# Patient Record
Sex: Female | Born: 1995 | Race: White | Hispanic: No | Marital: Single | Smoking: Former smoker
Health system: Southern US, Community
[De-identification: ages and names within clinical notes are randomized; demographics above are authoritative.]

## PROBLEM LIST (undated history)

## (undated) DIAGNOSIS — J353 Hypertrophy of tonsils with hypertrophy of adenoids: Secondary | ICD-10-CM

## (undated) DIAGNOSIS — I10 Essential (primary) hypertension: Secondary | ICD-10-CM

## (undated) DIAGNOSIS — G43909 Migraine, unspecified, not intractable, without status migrainosus: Secondary | ICD-10-CM

## (undated) DIAGNOSIS — T753XXA Motion sickness, initial encounter: Secondary | ICD-10-CM

## (undated) HISTORY — PX: TONSILLECTOMY: SUR1361

## (undated) HISTORY — PX: NO PAST SURGERIES: SHX2092

---

## 2017-09-18 ENCOUNTER — Encounter: Payer: Self-pay | Admitting: *Deleted

## 2017-09-18 DIAGNOSIS — J069 Acute upper respiratory infection, unspecified: Secondary | ICD-10-CM | POA: Diagnosis not present

## 2017-09-18 DIAGNOSIS — R05 Cough: Secondary | ICD-10-CM | POA: Diagnosis not present

## 2017-09-18 DIAGNOSIS — B9789 Other viral agents as the cause of diseases classified elsewhere: Secondary | ICD-10-CM | POA: Diagnosis not present

## 2017-09-18 DIAGNOSIS — R000 Tachycardia, unspecified: Secondary | ICD-10-CM | POA: Diagnosis not present

## 2017-09-18 DIAGNOSIS — J029 Acute pharyngitis, unspecified: Secondary | ICD-10-CM | POA: Diagnosis present

## 2017-09-18 DIAGNOSIS — R509 Fever, unspecified: Secondary | ICD-10-CM | POA: Insufficient documentation

## 2017-09-18 MED ORDER — ACETAMINOPHEN 500 MG PO TABS
1000.0000 mg | ORAL_TABLET | Freq: Once | ORAL | Status: AC
  Administered 2017-09-18: 1000 mg via ORAL
  Filled 2017-09-18: qty 2

## 2017-09-18 NOTE — ED Triage Notes (Signed)
Pt reports sore throat, fever cough for several weeks.  Worsening yesterday.  Pt is a IT sales professional.  Face flushed.

## 2017-09-19 ENCOUNTER — Emergency Department: Payer: Medicaid Other

## 2017-09-19 ENCOUNTER — Emergency Department
Admission: EM | Admit: 2017-09-19 | Discharge: 2017-09-19 | Disposition: A | Discharge status: Home / Self Care | Payer: Medicaid Other | Attending: Student in an Organized Health Care Education/Training Program | Admitting: Student in an Organized Health Care Education/Training Program

## 2017-09-19 DIAGNOSIS — J069 Acute upper respiratory infection, unspecified: Secondary | ICD-10-CM

## 2017-09-19 DIAGNOSIS — B9789 Other viral agents as the cause of diseases classified elsewhere: Secondary | ICD-10-CM

## 2017-09-19 LAB — CBC
HEMATOCRIT: 35.9 % (ref 35.0–47.0)
HEMOGLOBIN: 12.5 g/dL (ref 12.0–16.0)
MCH: 27.3 pg (ref 26.0–34.0)
MCHC: 34.8 g/dL (ref 32.0–36.0)
MCV: 78.4 fL — AB (ref 80.0–100.0)
Platelets: 317 10*3/uL (ref 150–440)
RBC: 4.59 MIL/uL (ref 3.80–5.20)
RDW: 14.3 % (ref 11.5–14.5)
WBC: 14.9 10*3/uL — AB (ref 3.6–11.0)

## 2017-09-19 LAB — BASIC METABOLIC PANEL
Anion gap: 10 (ref 5–15)
BUN: 11 mg/dL (ref 6–20)
CALCIUM: 9.2 mg/dL (ref 8.9–10.3)
CO2: 22 mmol/L (ref 22–32)
CREATININE: 0.9 mg/dL (ref 0.44–1.00)
Chloride: 105 mmol/L (ref 101–111)
GFR calc Af Amer: >60 mL/min (ref >60)
GLUCOSE: 116 mg/dL — AB (ref 65–99)
Potassium: 4.1 mmol/L (ref 3.5–5.1)
SODIUM: 137 mmol/L (ref 135–145)

## 2017-09-19 LAB — MONONUCLEOSIS SCREEN: MONO SCREEN: NEGATIVE

## 2017-09-19 LAB — POCT RAPID STREP A: Streptococcus, Group A Screen (Direct): NEGATIVE

## 2017-09-19 MED ORDER — DEXAMETHASONE SODIUM PHOSPHATE 10 MG/ML IJ SOLN
INTRAMUSCULAR | Status: AC
  Filled 2017-09-19: qty 1

## 2017-09-19 MED ORDER — DEXAMETHASONE 10 MG/ML FOR PEDIATRIC ORAL USE
10.0000 mg | Freq: Once | INTRAMUSCULAR | Status: AC
  Administered 2017-09-19: 10 mg via ORAL

## 2017-09-19 NOTE — Discharge Instructions (Signed)
Return immediately for worsening pain, difficulty swallowing or shortness of breath.
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
## 2017-09-19 NOTE — ED Notes (Signed)
Lab results reviewed. Awaiting room for MD eval.  

## 2017-09-19 NOTE — ED Notes (Signed)
Pt. Verbalizes understanding of d/c instructions and follow-up. VS stable and pain controlled per pt.  Pt. In NAD at time of d/c and denies further concerns regarding this visit. Pt. Stable at the time of departure from the unit, departing unit by the safest and most appropriate manner per that pt condition and limitations. Pt advised to return to the ED at any time for emergent concerns, or for new/worsening symptoms.   

## 2017-09-26 ENCOUNTER — Encounter: Payer: Self-pay | Admitting: *Deleted

## 2017-10-01 ENCOUNTER — Encounter: Payer: Self-pay | Admitting: *Deleted

## 2017-10-02 NOTE — Discharge Instructions (Signed)
T & A INSTRUCTION SHEET - [facility] °[facility] ° °CREIGHTON VAUGHT, MD °PAUL H. JUENGEL, MD  °P. [facility]OTT BENNETT °CHAPMAN MCQUEEN, MD ° °[address] [place], [place] [address] TEL. [phone] °[address] [place] [address] [phone] ° °INFORMATION SHEET FOR A TONSILLECTOMY AND ADENDOIDECTOMY ° °About Your Tonsils and Adenoids ° The tonsils and adenoids are normal body tissues that are part of our immune system.  They normally help to protect us against diseases that may enter our mouth and nose.  However, sometimes the tonsils and/or adenoids become too large and obstruct our breathing, especially at night. °  ° If either of these things happen it helps to remove the tonsils and adenoids in order to become healthier. The operation to remove the tonsils and adenoids is called a tonsillectomy and adenoidectomy. ° °The Location of Your Tonsils and Adenoids ° The tonsils are located in the back of the throat on both side and sit in a cradle of muscles. The adenoids are located in the roof of the mouth, behind the nose, and closely associated with the opening of the Eustachian tube to the ear. ° °Surgery on Tonsils and Adenoids ° A tonsillectomy and adenoidectomy is a short operation which takes about thirty minutes.  This includes being put to sleep and being awakened.  Tonsillectomies and adenoidectomies are performed at [facility] and may require observation period in the recovery room prior to going home. ° °Following the Operation for a Tonsillectomy ° A cautery machine is used to control bleeding.  Bleeding from a tonsillectomy and adenoidectomy is minimal and postoperatively the risk of bleeding is approximately four percent, although this rarely life threatening. ° ° ° °After your tonsillectomy and adenoidectomy post-op care at home: ° °1. Our patients are able to go home the same day.  You may be given prescriptions for pain  medications and antibiotics, if indicated. °2. It is extremely important to remember that fluid intake is of utmost importance after a tonsillectomy.  The amount that you drink must be maintained in the postoperative period.  A good indication of whether a child is getting enough fluid is whether his/her urine output is constant.  As long as children are urinating or wetting their diaper every 6 - 8 hours this is usually enough fluid intake.   °3. Although rare, this is a risk of some bleeding in the first ten days after surgery.  This is usually occurs between day five and nine postoperatively.  This risk of bleeding is approximately four percent.  If you or your child should have any bleeding you should remain calm and notify our office or go directly to the Emergency Room at [facility] where they will contact us. Our doctors are available seven days a week for notification.  We recommend sitting up quietly in a chair, place an ice pack on the front of the neck and spitting out the blood gently until we are able to contact you.  Adults should gargle gently with ice water and this may help stop the bleeding.  If the bleeding does not stop after a short time, i.e. 10 to 15 minutes, or seems to be increasing again, please contact us or go to the hospital.   °4. It is common for the pain to be worse at 5 - 7 days postoperatively.  This occurs because the “scab” is peeling off and the mucous membrane (skin of   the throat) is growing back where the tonsils were.   °5. It is common for a low-grade fever, less than 102, during the first week after a tonsillectomy and adenoidectomy.  It is usually due to not drinking enough liquids, and we suggest your use liquid Tylenol or the pain medicine with Tylenol prescribed in order to keep your temperature below 102.  Please follow the directions on the back of the bottle. °6. Do not take aspirin or any products that contain aspirin such as Bufferin, Anacin,  Ecotrin, aspirin gum, Goodies, BC headache powders, etc., after a T&A because it can promote bleeding.  Please check with our office before administering any other medication that may been prescribed by other doctors during the two week post-operative period. °7. If you happen to look in the mirror or into your child’s mouth you will see white/gray patches on the back of the throat.  This is what a scab looks like in the mouth and is normal after having a T&A.  It will disappear once the tonsil area heals completely. However, it may cause a noticeable odor, and this too will disappear with time.     °8. You or your child may experience ear pain after having a T&A.  This is called referred pain and comes from the throat, but it is felt in the ears.  Ear pain is quite common and expected.  It will usually go away after ten days.  There is usually nothing wrong with the ears, and it is primarily due to the healing area stimulating the nerve to the ear that runs along the side of the throat.  Use either the prescribed pain medicine or Tylenol as needed.  °9. The throat tissues after a tonsillectomy are obviously sensitive.  Smoking around children who have had a tonsillectomy significantly increases the risk of bleeding.  DO NOT SMOKE!  ° °General Anesthesia, Adult, Care After °These instructions provide you with information about caring for yourself after your procedure. Your health care provider may also give you more specific instructions. Your treatment has been planned according to current medical practices, but problems sometimes occur. Call your health care provider if you have any problems or questions after your procedure. °What can I expect after the procedure? °After the procedure, it is common to have: °· Vomiting. °· A sore throat. °· Mental slowness. ° °It is common to feel: °· Nauseous. °· Cold or shivery. °· Sleepy. °· Tired. °· Sore or achy, even in parts of your body where you did not have  surgery. ° °Follow these instructions at home: °For at least 24 hours after the procedure: °· Do not: °? Participate in activities where you could fall or become injured. °? Drive. °? Use heavy machinery. °? Drink alcohol. °? Take sleeping pills or medicines that cause drowsiness. °? Make important decisions or sign legal documents. °? Take care of children on your own. °· Rest. °Eating and drinking °· If you vomit, drink water, juice, or soup when you can drink without vomiting. °· Drink enough fluid to keep your urine clear or pale yellow. °· Make sure you have little or no nausea before eating solid foods. °· Follow the diet recommended by your health care provider. °General instructions °· Have a responsible adult stay with you until you are awake and alert. °· Return to your normal activities as told by your health care provider. Ask your health care provider what activities are safe for you. °· Take over-the-counter and   prescription medicines only as told by your health care provider. °· If you smoke, do not smoke without supervision. °· Keep all follow-up visits as told by your health care provider. This is important. °Contact a health care provider if: °· You continue to have nausea or vomiting at home, and medicines are not helpful. °· You cannot drink fluids or start eating again. °· You cannot urinate after 8-12 hours. °· You develop a skin rash. °· You have fever. °· You have increasing redness at the site of your procedure. °Get help right away if: °· You have difficulty breathing. °· You have chest pain. °· You have unexpected bleeding. °· You feel that you are having a life-threatening or urgent problem. °This information is not intended to replace advice given to you by your health care provider. Make sure you discuss any questions you have with your health care provider. °Document Released: 03/12/2001 Document Revised: 05/08/2016 Document Reviewed: 11/18/2015 °Elsevier Interactive Patient Education  © 2018 [employer] ° °

## 2017-10-03 ENCOUNTER — Ambulatory Visit: Payer: Medicaid Other | Admitting: Anesthesiology

## 2017-10-03 ENCOUNTER — Ambulatory Visit
Admission: RE | Admit: 2017-10-03 | Discharge: 2017-10-03 | Disposition: A | Discharge status: Home / Self Care | Payer: Medicaid Other | Source: Ambulatory Visit | Attending: Otolaryngology | Admitting: Otolaryngology

## 2017-10-03 ENCOUNTER — Encounter
Admission: RE | Disposition: A | Discharge status: Home / Self Care | Payer: Self-pay | Source: Ambulatory Visit | Attending: Otolaryngology

## 2017-10-03 DIAGNOSIS — Z87891 Personal history of nicotine dependence: Secondary | ICD-10-CM | POA: Insufficient documentation

## 2017-10-03 DIAGNOSIS — J353 Hypertrophy of tonsils with hypertrophy of adenoids: Secondary | ICD-10-CM | POA: Diagnosis not present

## 2017-10-03 HISTORY — DX: Hypertrophy of tonsils with hypertrophy of adenoids: J35.3

## 2017-10-03 HISTORY — DX: Migraine, unspecified, not intractable, without status migrainosus: G43.909

## 2017-10-03 HISTORY — PX: ADENOIDECTOMY: SHX5191

## 2017-10-03 HISTORY — PX: TONSILLECTOMY: SHX5217

## 2017-10-03 HISTORY — DX: Motion sickness, initial encounter: T75.3XXA

## 2017-10-03 SURGERY — TONSILLECTOMY
Anesthesia: General | Wound class: Clean Contaminated

## 2017-10-03 MED ORDER — LACTATED RINGERS IV SOLN
10.0000 mL/h | INTRAVENOUS | Status: DC
  Administered 2017-10-03: 10 mL/h via INTRAVENOUS

## 2017-10-03 MED ORDER — FENTANYL CITRATE (PF) 100 MCG/2ML IJ SOLN
25.0000 ug | INTRAMUSCULAR | Status: DC | PRN
  Administered 2017-10-03: 25 ug via INTRAVENOUS
  Administered 2017-10-03 (×2): 50 ug via INTRAVENOUS
  Administered 2017-10-03: 25 ug via INTRAVENOUS

## 2017-10-03 MED ORDER — LIDOCAINE VISCOUS 2 % MT SOLN: 10.0000 mL | Freq: Four times a day (QID) | OROMUCOSAL | 0 refills | Status: AC | PRN

## 2017-10-03 MED ORDER — OXYMETAZOLINE HCL 0.05 % NA SOLN
NASAL | Status: DC | PRN
  Administered 2017-10-03: 1 via TOPICAL

## 2017-10-03 MED ORDER — LIDOCAINE HCL (CARDIAC) 20 MG/ML IV SOLN
INTRAVENOUS | Status: DC | PRN
  Administered 2017-10-03: 40 mg via INTRAVENOUS

## 2017-10-03 MED ORDER — MIDAZOLAM HCL 5 MG/5ML IJ SOLN
INTRAMUSCULAR | Status: DC | PRN
  Administered 2017-10-03: 2 mg via INTRAVENOUS

## 2017-10-03 MED ORDER — OXYCODONE HCL 5 MG/5ML PO SOLN
5.0000 mg | Freq: Once | ORAL | Status: AC | PRN
  Administered 2017-10-03: 5 mg via ORAL

## 2017-10-03 MED ORDER — BUPIVACAINE HCL (PF) 0.25 % IJ SOLN
INTRAMUSCULAR | Status: DC | PRN
  Administered 2017-10-03: 1 mL

## 2017-10-03 MED ORDER — OXYCODONE HCL 5 MG PO TABS: 5.0000 mg | ORAL_TABLET | Freq: Once | ORAL | Status: AC | PRN

## 2017-10-03 MED ORDER — ONDANSETRON HCL 4 MG PO TABS: 4.0000 mg | ORAL_TABLET | Freq: Three times a day (TID) | ORAL | 0 refills | Status: AC | PRN

## 2017-10-03 MED ORDER — FENTANYL CITRATE (PF) 100 MCG/2ML IJ SOLN
INTRAMUSCULAR | Status: DC | PRN
  Administered 2017-10-03: 50 ug via INTRAVENOUS
  Administered 2017-10-03: 100 ug via INTRAVENOUS
  Administered 2017-10-03: 50 ug via INTRAVENOUS

## 2017-10-03 MED ORDER — MEPERIDINE HCL 25 MG/ML IJ SOLN: 6.2500 mg | INTRAMUSCULAR | Status: DC | PRN

## 2017-10-03 MED ORDER — OXYCODONE HCL 5 MG/5ML PO SOLN: 10.0000 mg | ORAL | 0 refills | Status: AC | PRN

## 2017-10-03 MED ORDER — ONDANSETRON HCL 4 MG/2ML IJ SOLN
INTRAMUSCULAR | Status: DC | PRN
  Administered 2017-10-03: 4 mg via INTRAVENOUS

## 2017-10-03 MED ORDER — ACETAMINOPHEN 10 MG/ML IV SOLN
1000.0000 mg | Freq: Once | INTRAVENOUS | Status: AC
  Administered 2017-10-03: 1000 mg via INTRAVENOUS

## 2017-10-03 MED ORDER — DEXAMETHASONE SODIUM PHOSPHATE 4 MG/ML IJ SOLN
INTRAMUSCULAR | Status: DC | PRN
  Administered 2017-10-03: 10 mg via INTRAVENOUS

## 2017-10-03 MED ORDER — SUCCINYLCHOLINE CHLORIDE 20 MG/ML IJ SOLN
INTRAMUSCULAR | Status: DC | PRN
  Administered 2017-10-03: 100 mg via INTRAVENOUS

## 2017-10-03 MED ORDER — PROMETHAZINE HCL 25 MG/ML IJ SOLN
6.2500 mg | INTRAMUSCULAR | Status: DC | PRN
  Administered 2017-10-03: 6.25 mg via INTRAVENOUS

## 2017-10-03 MED ORDER — PROPOFOL 10 MG/ML IV BOLUS
INTRAVENOUS | Status: DC | PRN
  Administered 2017-10-03: 200 mg via INTRAVENOUS

## 2017-10-03 SURGICAL SUPPLY — 17 items
BLADE BOVIE TIP EXT 4 (BLADE) ×4 IMPLANT
CANISTER SUCT 1200ML W/VALVE (MISCELLANEOUS) ×4 IMPLANT
CATH ROBINSON RED A/P 10FR (CATHETERS) ×4 IMPLANT
COAG SUCT 10F 3.5MM HAND CTRL (MISCELLANEOUS) ×4 IMPLANT
GLOVE BIO SURGEON STRL SZ7.5 (GLOVE) ×4 IMPLANT
HANDLE SUCTION POOLE (INSTRUMENTS) ×2 IMPLANT
KIT ROOM TURNOVER OR (KITS) ×4 IMPLANT
NEEDLE HYPO 25GX1X1/2 BEV (NEEDLE) ×4 IMPLANT
NS IRRIG 500ML POUR BTL (IV SOLUTION) ×4 IMPLANT
PACK TONSIL/ADENOIDS (PACKS) ×4 IMPLANT
PAD GROUND ADULT SPLIT (MISCELLANEOUS) ×4 IMPLANT
PENCIL ELECTRO HAND CTR (MISCELLANEOUS) ×4 IMPLANT
SOL ANTI-FOG 6CC FOG-OUT (MISCELLANEOUS) ×2 IMPLANT
SOL FOG-OUT ANTI-FOG 6CC (MISCELLANEOUS) ×2
STRAP BODY AND KNEE 60X3 (MISCELLANEOUS) ×4 IMPLANT
SUCTION POOLE HANDLE (INSTRUMENTS) ×4
SYR 5ML LL (SYRINGE) ×4 IMPLANT

## 2017-10-03 NOTE — Anesthesia Preprocedure Evaluation (Signed)
Anesthesia Evaluation  Patient identified by MRN, date of birth, ID band Patient awake    Reviewed: Allergy & Precautions, H&P , NPO status , Patient's Chart, lab work & pertinent test results, reviewed documented beta blocker date and time   Airway Mallampati: II  TM Distance: >3 FB Neck ROM: full    Dental no notable dental hx.    Pulmonary neg pulmonary ROS, former smoker,    Pulmonary exam normal breath sounds clear to auscultation       Cardiovascular Exercise Tolerance: Good negative cardio ROS   Rhythm:regular Rate:Normal     Neuro/Psych negative neurological ROS  negative psych ROS   GI/Hepatic negative GI ROS, Neg liver ROS,   Endo/Other  negative endocrine ROS  Renal/GU negative Renal ROS  negative genitourinary   Musculoskeletal   Abdominal   Peds  Hematology negative hematology ROS (+)   Anesthesia Other Findings BMI 34  Reproductive/Obstetrics negative OB ROS                            Anesthesia Physical Anesthesia Plan  ASA: II  Anesthesia Plan: General   Post-op Pain Management:    Induction:   PONV Risk Score and Plan:   Airway Management Planned:   Additional Equipment:   Intra-op Plan:   Post-operative Plan:   Informed Consent: I have reviewed the patients History and Physical, chart, labs and discussed the procedure including the risks, benefits and alternatives for the proposed anesthesia with the patient or authorized representative who has indicated his/her understanding and acceptance.   Dental Advisory Given  Plan Discussed with: CRNA  Anesthesia Plan Comments:         Anesthesia Quick Evaluation

## 2017-10-03 NOTE — Op Note (Signed)
..  10/03/2017  7:40 AM    Veronica Nielsen, Veronica Nielsen  [id number]   Pre-Op Dx:  TONSILLECTOMY POSSIBLE ADENOIDECTOMY  Post-op Dx: TONSILLECTOMY POSSIBLE ADENOIDECTOMY  Proc:Tonsillectomy and Adenoidectomy > age 21  Surg: Andee Chivers  Anes:  General Endotracheal  EBL:  <525ml  Comp:  None  Findings:  4+ cryptic and erythematous tonsils with tonsillolithiasis  Procedure: After the patient was identified in holding and the history and physical and consent was reviewed, the patient was taken to the operating room and placed in a supine position.  General endotracheal anesthesia was induced in the normal fashion.  At this time, the patient was rotated 45 degrees and a shoulder roll was placed.  At this time, a McIvor mouthgag was inserted into the patient's oral cavity and suspended from the Mayo stand without injury to teeth, lips, or gums.  Next a red rubber catheter was inserted into the patient left nostril for retraction of the uvula and soft palate superiorly.  Next a curved Alice clamp was attached to the patient's right superior tonsillar pole and retracted medially and inferiorly.  A Bovie electrocautery was used to dissect the patient's right tonsil in a subcapsular plane.  Meticulous hemostasis was achieved with Bovie suction cautery.  At this time, the mouth gag was released from suspension for 1 minute.  Attention now was directed to the patient's left side.  In a similar fashion the curved Alice clamp was attached to the superior pole and this was retracted medially and inferiorly and the tonsil was excised in a subcapsular plane with Bovie electrocautery.  After completion of the second tonsil, meticulous hemostasis was continued.  At this time, attention was directed to the patient's Adenoidectomy.  Under indirect visualization using an operating mirror, the adenoid tissue was visualized and noted to be partially obstructive in nature.  Using a bovie suction cautery, the adenoid  tissue was  ablated and desiccated for a widely patent nasopharynx.  Meticulous hemostasis was continued.  At this time, the patient's nasal cavity and oral cavity was irrigated with sterile saline.  One ml of 0.25% Marcaine was injected into the anterior and posterior tonsillar fossa bilaterally.  Following this  The care of patient was returned to anesthesia, awakened, and transferred to recovery in stable condition.  Dispo:  PACU to home  Plan: Soft diet.  Limit exercise and strenuous activity for 2 weeks.  Fluid hydration  Recheck my office three weeks.   Veronica Nielsen 7:40 AM 10/03/2017

## 2017-10-03 NOTE — Anesthesia Procedure Notes (Signed)
Procedure Name: Intubation Date/Time: 10/03/2017 7:20 AM Performed by: Londell Moh Pre-anesthesia Checklist: Patient identified, Emergency Drugs available, Suction available, Patient being monitored and Timeout performed Patient Re-evaluated:Patient Re-evaluated prior to induction Oxygen Delivery Method: Circle system utilized Preoxygenation: Pre-oxygenation with 100% oxygen Induction Type: IV induction Ventilation: Mask ventilation without difficulty Laryngoscope Size: Mac and 3 Grade View: Grade I Tube type: Oral Rae Tube size: 7.0 mm Number of attempts: 1 Placement Confirmation: ETT inserted through vocal cords under direct vision,  positive ETCO2 and breath sounds checked- equal and bilateral Tube secured with: Tape Dental Injury: Teeth and Oropharynx as per pre-operative assessment

## 2017-10-03 NOTE — Transfer of Care (Signed)
Immediate Anesthesia Transfer of Care Note  Patient: Veronica Nielsen  Procedure(s) Performed: TONSILLECTOMY (N/A ) ADENOIDECTOMY  Patient Location: PACU  Anesthesia Type: General  Level of Consciousness: awake, alert  and patient cooperative  Airway and Oxygen Therapy: Patient Spontanous Breathing and Patient connected to supplemental oxygen  Post-op Assessment: Post-op Vital signs reviewed, Patient's Cardiovascular Status Stable, Respiratory Function Stable, Patent Airway and No signs of Nausea or vomiting  Post-op Vital Signs: Reviewed and stable  Complications: No apparent anesthesia complications

## 2017-10-03 NOTE — H&P (Signed)
..  History and Physical paper copy reviewed and updated date of procedure and will be scanned into system.  Patient seen and examined.  

## 2017-10-03 NOTE — Anesthesia Postprocedure Evaluation (Signed)
Anesthesia Post Note  Patient: Veronica Nielsen  Procedure(s) Performed: TONSILLECTOMY (N/A ) ADENOIDECTOMY  Patient location during evaluation: PACU Anesthesia Type: General Level of consciousness: awake and alert Pain management: pain level controlled Vital Signs Assessment: post-procedure vital signs reviewed and stable Respiratory status: spontaneous breathing, nonlabored ventilation, respiratory function stable and patient connected to nasal cannula oxygen Cardiovascular status: blood pressure returned to baseline and stable Postop Assessment: no apparent nausea or vomiting Anesthetic complications: no    SCOURAS, NICOLE ELAINE

## 2017-10-04 ENCOUNTER — Encounter: Payer: Self-pay | Admitting: Otolaryngology

## 2017-10-05 LAB — SURGICAL PATHOLOGY
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
## 2018-09-01 ENCOUNTER — Emergency Department
Admission: EM | Admit: 2018-09-01 | Discharge: 2018-09-01 | Disposition: A | Discharge status: Home / Self Care | Payer: Medicaid Other | Attending: Emergency Medicine | Admitting: Emergency Medicine

## 2018-09-01 ENCOUNTER — Other Ambulatory Visit: Payer: Self-pay

## 2018-09-01 DIAGNOSIS — I10 Essential (primary) hypertension: Secondary | ICD-10-CM | POA: Insufficient documentation

## 2018-09-01 DIAGNOSIS — Z87891 Personal history of nicotine dependence: Secondary | ICD-10-CM | POA: Insufficient documentation

## 2018-09-01 DIAGNOSIS — R42 Dizziness and giddiness: Secondary | ICD-10-CM

## 2018-09-01 DIAGNOSIS — R55 Syncope and collapse: Secondary | ICD-10-CM

## 2018-09-01 DIAGNOSIS — I951 Orthostatic hypotension: Secondary | ICD-10-CM | POA: Insufficient documentation

## 2018-09-01 DIAGNOSIS — Z79899 Other long term (current) drug therapy: Secondary | ICD-10-CM | POA: Insufficient documentation

## 2018-09-01 HISTORY — DX: Essential (primary) hypertension: I10

## 2018-09-01 LAB — CBC
HEMATOCRIT: 37.3 % (ref 35.0–47.0)
Hemoglobin: 12.7 g/dL (ref 12.0–16.0)
MCH: 26 pg (ref 26.0–34.0)
MCHC: 34 g/dL (ref 32.0–36.0)
MCV: 76.5 fL — AB (ref 80.0–100.0)
PLATELETS: 362 10*3/uL (ref 150–440)
RBC: 4.87 MIL/uL (ref 3.80–5.20)
RDW: 14.7 % — ABNORMAL HIGH (ref 11.5–14.5)
WBC: 13.2 10*3/uL — AB (ref 3.6–11.0)

## 2018-09-01 LAB — BASIC METABOLIC PANEL
ANION GAP: 11 (ref 5–15)
BUN: 14 mg/dL (ref 6–20)
CALCIUM: 9.4 mg/dL (ref 8.9–10.3)
CO2: 22 mmol/L (ref 22–32)
Chloride: 107 mmol/L (ref 98–111)
Creatinine, Ser: 0.62 mg/dL (ref 0.44–1.00)
GLUCOSE: 102 mg/dL — AB (ref 70–99)
Potassium: 3.6 mmol/L (ref 3.5–5.1)
SODIUM: 140 mmol/L (ref 135–145)

## 2018-09-01 LAB — URINALYSIS, COMPLETE (UACMP) WITH MICROSCOPIC
BACTERIA UA: NONE SEEN
BILIRUBIN URINE: NEGATIVE
GLUCOSE, UA: NEGATIVE mg/dL
Hgb urine dipstick: NEGATIVE
Ketones, ur: NEGATIVE mg/dL
LEUKOCYTES UA: NEGATIVE
NITRITE: NEGATIVE
Protein, ur: NEGATIVE mg/dL
SPECIFIC GRAVITY, URINE: 1.021 (ref 1.005–1.030)
pH: 7 (ref 5.0–8.0)

## 2018-09-01 LAB — TSH: TSH: 3.253 u[IU]/mL (ref 0.350–4.500)

## 2018-09-01 LAB — PREGNANCY, URINE: PREG TEST UR: NEGATIVE

## 2018-09-01 LAB — TROPONIN I: Troponin I: <0.03 ng/mL (ref <0.03)

## 2018-09-01 MED ORDER — SODIUM CHLORIDE 0.9 % IV BOLUS
1000.0000 mL | Freq: Once | INTRAVENOUS | Status: AC
  Administered 2018-09-01: 1000 mL via INTRAVENOUS
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
## 2018-09-01 NOTE — ED Notes (Signed)
Pt c/o pain around IV site when IVF on pump, changed fluids to hang from gravity.

## 2018-09-01 NOTE — ED Provider Notes (Signed)
[facility] Emergency Department Provider Note  ____________________________________________  Time seen: Approximately 3:53 PM  I have reviewed the triage vital signs and the nursing notes.   HISTORY  Chief Complaint Dizziness   HPI Veronica Nielsen is a 22 y.o. female with a history of hypertension and anxiety who presents for evaluation of dizziness.  Patient reports that she has been followed by her primary care doctor for a year for borderline elevated blood pressure.  She reports that her blood pressure is usually in the 130s at home.  She has been checking it regularly at home and reports that over the last week the blood pressure has been going up and she has had several measurements with blood pressure up to the 160s.  Today she was driving when she started feeling lightheaded/feeling like she was going to pass out.  She drove into [place] rescue.  Over there she was noted to have blood pressure of 190/110 and tachycardic with a pulse between 120 and 130s and was brought into the ED for evaluation.  She reports eating and drinking normally today.  She is complaining of a mild diffuse throbbing headache which has started after this episode of lightheadedness, no thunderclap headache, no changes in vision, no nausea or vomiting, no chest pain or palpitations, no shortness of breath.  No personal or family history of blood clots, recent travel immobilization, leg pain or swelling, hemoptysis, or exogenous hormones.  She reports feeling dizzy when I sat her up in the stretcher.   Past Medical History:  Diagnosis Date  . Hypertension   . Hypertrophy of tonsils and adenoids   . Migraine headache    stress induced. 1-2x/mo  . Motion sickness    cars    There are no active problems to display for this patient.   Past Surgical History:  Procedure Laterality Date  . ADENOIDECTOMY  10/03/2017   Procedure: ADENOIDECTOMY;  Surgeon: Bud FaceVaught, Creighton, MD;   [facility];  Service: ENT;;  . NO PAST SURGERIES    . TONSILLECTOMY N/A 10/03/2017   Procedure: TONSILLECTOMY;  Surgeon: Bud FaceVaught, Creighton, MD;  [facility];  Service: ENT;  Laterality: N/A;  . TONSILLECTOMY      Prior to Admission medications   Medication Sig Start Date End Date Taking? Authorizing Provider  lidocaine (XYLOCAINE) 2 % solution Use as directed 10 mLs in the mouth or throat every 6 (six) hours as needed for mouth pain (Swish and spit). 10/03/17   Vaught, Roney Mansreighton, MD  norethindrone (CAMILA) 0.35 MG tablet Take 1 tablet by mouth daily.    [provider]  ondansetron (ZOFRAN) 4 MG tablet Take 1 tablet (4 mg total) by mouth every 8 (eight) hours as needed for nausea or vomiting. 10/03/17   Vaught, Roney Mansreighton, MD  oxyCODONE (ROXICODONE) 5 MG/5ML solution Take 10 mLs (10 mg total) by mouth every 4 (four) hours as needed for severe pain. 10/03/17   Bud FaceVaught, Creighton, MD  sertraline (ZOLOFT) 25 MG tablet Take 25 mg by mouth daily.    [provider]    Allergies Patient has no known allergies.  No family history on file.  Social History Social History   Tobacco Use  . Smoking status: Former Smoker    Years: 0.50    Types: Cigarettes    Last attempt to quit: 12/2016    Years since quitting: 1.7  . Smokeless tobacco: Never Used  Substance Use Topics  . Alcohol use: Yes  Alcohol/week: 1.0 standard drinks    Types: 1 Glasses of wine per week  . Drug use: No    Review of Systems  Constitutional: Negative for fever. + Lightheadedness Eyes: Negative for visual changes. ENT: Negative for sore throat. Neck: No neck pain  Cardiovascular: Negative for chest pain. Respiratory: Negative for shortness of breath. Gastrointestinal: Negative for abdominal pain, vomiting or diarrhea. Genitourinary: Negative for dysuria. Musculoskeletal: Negative for back pain. Skin: Negative for rash. Neurological: Negative for weakness  or numbness. +  headaches Psych: No SI or HI  ____________________________________________   PHYSICAL EXAM:  VITAL SIGNS: ED Triage Vitals  Enc Vitals Group     BP 09/01/18 1545 (!) 163/104     Pulse Rate 09/01/18 1543 (!) 133     Resp 09/01/18 1543 18     Temp 09/01/18 1545 98.1 F (36.7 C)     Temp Source 09/01/18 1545 Oral     SpO2 09/01/18 1543 98 %     Weight 09/01/18 1544 180 lb (81.6 kg)     Height 09/01/18 1544 5\' 3"  (1.6 m)     Head Circumference --      Peak Flow --      Pain Score 09/01/18 1542 10     Pain Loc --      Pain Edu? --      Excl. in GC? --     Constitutional: Alert and oriented, anxious, no apparent distress. HEENT:      Head: Normocephalic and atraumatic.         Eyes: Conjunctivae are normal. Sclera is non-icteric.       Mouth/Throat: Mucous membranes are moist.       Neck: Supple with no signs of meningismus. Cardiovascular: Tachycardic with regular rhythm. No murmurs, gallops, or rubs. 2+ symmetrical distal pulses are present in all extremities. No JVD. Respiratory: Normal respiratory effort. Lungs are clear to auscultation bilaterally. No wheezes, crackles, or rhonchi.  Gastrointestinal: Soft, non tender, and non distended with positive bowel sounds. No rebound or guarding. Musculoskeletal: Nontender with normal range of motion in all extremities. No edema, cyanosis, or erythema of extremities. Neurologic: Normal speech and language. A & O x3, PERRL, EOMI, no nystagmus, CN II-XII intact, motor testing reveals good tone and bulk throughout. There is no evidence of pronator drift or dysmetria. Muscle strength is 5/5 throughout. Sensory examination is intact. Gait is normal. Skin: Skin is warm, dry and intact. No rash noted. Psychiatric: Mood and affect are normal. Speech and behavior are normal.  ____________________________________________   LABS (all labs ordered are listed, but only abnormal results are displayed)  Labs Reviewed    URINALYSIS, COMPLETE (UACMP) WITH MICROSCOPIC - Abnormal; Notable for the following components:      Result Value   Color, Urine YELLOW (*)    APPearance HAZY (*)    All other components within normal limits  CBC - Abnormal; Notable for the following components:   WBC 13.2 (*)    MCV 76.5 (*)    RDW 14.7 (*)    All other components within normal limits  BASIC METABOLIC PANEL - Abnormal; Notable for the following components:   Glucose, Bld 102 (*)    All other components within normal limits  TSH  TROPONIN I  PREGNANCY, URINE   ____________________________________________  EKG  ED ECG REPORT I, Nita Sickle, the attending physician, personally viewed and interpreted this ECG.  Sinus tachycardia, normal intervals, normal axis, no STE or depressions, no evidence of HOCM,  AV block, delta wave, ARVD, prolonged QTc, WPW, or Brugada.  ____________________________________________  RADIOLOGY  none  ____________________________________________   PROCEDURES  Procedure(s) performed: None Procedures Critical Care performed:  None ____________________________________________   INITIAL IMPRESSION / ASSESSMENT AND PLAN / ED COURSE   22 y.o. female with a history of hypertension and anxiety who presents for evaluation of lightheadedness/near syncope while driving in the setting of 1 week of progressively worsening elevated blood pressure.  Patient looks extremely anxious on exam, she is tachycardic and hypertensive however once she is able to calm down her blood pressure goes from 160s to the 140s and her heart rate improved to the 90s.  She is slightly orthostatic on exam.  Will give IVF. She is otherwise completely neurologically intact with no thunderclap headache and no clinical signs or symptoms of subarachnoid hemorrhage.  Will monitor on telemetry for any signs of arrhythmia, will get an EKG.  Will check labs to rule out electrolyte abnormalities, dehydration, anemia,  pregnancy, or thyroid dysfunction.   Clinical Course as of Sep 02 1815  Wynelle Link Sep 01, 2018  1812 Patient feeling markedly improved after IVF. BP now 128/86. Monitored on telemetry with no evidence of dysrhythmias.  Remains well appearing, neuro intact. Labs with no acute findings. Plan for f/u with PCP for further management of BP. Discussed return precautions for CP, SOB, thunderclap HA, fever, or further dizzy spells.   [CV]    Clinical Course User Index [CV] Don Perking [place], MD     As part of my medical decision making, I reviewed the following data within the electronic MEDICAL RECORD NUMBER Nursing notes reviewed and incorporated, Labs reviewed , EKG interpreted , Old EKG reviewed, Old chart reviewed, Radiograph reviewed , Notes from prior ED visits and [place] Controlled Substance Database    Pertinent labs & imaging results that were available during my care of the patient were reviewed by me and considered in my medical decision making (see chart for details).    ____________________________________________   FINAL CLINICAL IMPRESSION(S) / ED DIAGNOSES  Final diagnoses:  Near syncope  Orthostatic dizziness      NEW MEDICATIONS STARTED DURING THIS VISIT:  ED Discharge Orders    None       Note:  This document was prepared using Dragon voice recognition software and may include unintentional dictation errors.    Don Perking, [place], MD 09/01/18 [phone]

## 2018-12-13 ENCOUNTER — Encounter: Payer: Self-pay | Admitting: Emergency Medicine

## 2018-12-13 ENCOUNTER — Emergency Department
Admission: EM | Admit: 2018-12-13 | Discharge: 2018-12-13 | Disposition: A | Discharge status: Home / Self Care | Payer: Medicaid Other | Attending: Emergency Medicine | Admitting: Emergency Medicine

## 2018-12-13 ENCOUNTER — Other Ambulatory Visit: Payer: Self-pay

## 2018-12-13 DIAGNOSIS — G4700 Insomnia, unspecified: Secondary | ICD-10-CM

## 2018-12-13 DIAGNOSIS — F419 Anxiety disorder, unspecified: Secondary | ICD-10-CM | POA: Insufficient documentation

## 2018-12-13 DIAGNOSIS — Z79899 Other long term (current) drug therapy: Secondary | ICD-10-CM | POA: Insufficient documentation

## 2018-12-13 DIAGNOSIS — I10 Essential (primary) hypertension: Secondary | ICD-10-CM | POA: Insufficient documentation

## 2018-12-13 DIAGNOSIS — Z87891 Personal history of nicotine dependence: Secondary | ICD-10-CM | POA: Insufficient documentation

## 2018-12-13 LAB — COMPREHENSIVE METABOLIC PANEL
ALT: 31 U/L (ref 0–44)
AST: 22 U/L (ref 15–41)
Albumin: 4.2 g/dL (ref 3.5–5.0)
Alkaline Phosphatase: 78 U/L (ref 38–126)
Anion gap: 9 (ref 5–15)
BUN: 10 mg/dL (ref 6–20)
CHLORIDE: 108 mmol/L (ref 98–111)
CO2: 21 mmol/L — AB (ref 22–32)
CREATININE: 0.58 mg/dL (ref 0.44–1.00)
Calcium: 9 mg/dL (ref 8.9–10.3)
GFR calc Af Amer: >60 mL/min (ref >60)
Glucose, Bld: 112 mg/dL — ABNORMAL HIGH (ref 70–99)
Potassium: 3.9 mmol/L (ref 3.5–5.1)
Sodium: 138 mmol/L (ref 135–145)
Total Bilirubin: 0.6 mg/dL (ref 0.3–1.2)
Total Protein: 7.8 g/dL (ref 6.5–8.1)

## 2018-12-13 LAB — CBC
HEMATOCRIT: 40.7 % (ref 36.0–46.0)
HEMOGLOBIN: 13.2 g/dL (ref 12.0–15.0)
MCH: 25.2 pg — ABNORMAL LOW (ref 26.0–34.0)
MCHC: 32.4 g/dL (ref 30.0–36.0)
MCV: 77.8 fL — AB (ref 80.0–100.0)
NRBC: 0 % (ref 0.0–0.2)
Platelets: 391 10*3/uL (ref 150–400)
RBC: 5.23 MIL/uL — AB (ref 3.87–5.11)
RDW: 13.9 % (ref 11.5–15.5)
WBC: 13.4 10*3/uL — ABNORMAL HIGH (ref 4.0–10.5)

## 2018-12-13 LAB — URINE DRUG SCREEN, QUALITATIVE (ARMC ONLY)
AMPHETAMINES, UR SCREEN: NOT DETECTED
Barbiturates, Ur Screen: NOT DETECTED
Benzodiazepine, Ur Scrn: NOT DETECTED
COCAINE METABOLITE, UR ~~LOC~~: NOT DETECTED
Cannabinoid 50 Ng, Ur ~~LOC~~: NOT DETECTED
MDMA (ECSTASY) UR SCREEN: NOT DETECTED
METHADONE SCREEN, URINE: NOT DETECTED
OPIATE, UR SCREEN: NOT DETECTED
PHENCYCLIDINE (PCP) UR S: NOT DETECTED
Tricyclic, Ur Screen: NOT DETECTED

## 2018-12-13 LAB — SALICYLATE LEVEL: Salicylate Lvl: <7 mg/dL (ref 2.8–30.0)

## 2018-12-13 LAB — ACETAMINOPHEN LEVEL: Acetaminophen (Tylenol), Serum: <10 ug/mL — ABNORMAL LOW (ref 10–30)

## 2018-12-13 LAB — PREGNANCY, URINE: Preg Test, Ur: NEGATIVE

## 2018-12-13 LAB — ETHANOL

## 2018-12-13 MED ORDER — TRAZODONE HCL 50 MG PO TABS: 50.0000 mg | ORAL_TABLET | Freq: Every day | ORAL | 0 refills | Status: AC

## 2018-12-13 NOTE — ED Notes (Signed)
Patient voices understanding of discharge instructions, no signs of distress, all belongings given back to Pa[facility], Patient called for transport.

## 2018-12-13 NOTE — ED Provider Notes (Signed)
[facility] Emergency Department Provider Note  Time seen: 7:15 AM  I have reviewed the triage vital signs and the nursing notes.   HISTORY  Chief Complaint Sleep Anxiety  HPI Veronica Nielsen is a 22 y.o. female with a past medical history of hypertension, migraines, presents to the emergency department with difficulty sleeping.  According to the patient over the past 3 to 4 weeks she has been experiencing significant difficulty sleeping states she can only sleep 2 to 3 hours each night and it is causing her to feel very anxious and she is having panic attacks once again per patient.  Patient states for the past 2 days she feels like she has not slept at all, and had a panic attack this morning which she blames on lack of sleep.  Patient denies any medical complaints, denies any SI or HI, denies any recreational drug use or alcohol use.   Past Medical History:  Diagnosis Date  . Hypertension   . Hypertrophy of tonsils and adenoids   . Migraine headache    stress induced. 1-2x/mo  . Motion sickness    cars    There are no active problems to display for this patient.   Past Surgical History:  Procedure Laterality Date  . ADENOIDECTOMY  10/03/2017   Procedure: ADENOIDECTOMY;  Surgeon: Bud FaceVaught, Creighton, MD;  [facility];  Service: ENT;;  . NO PAST SURGERIES    . TONSILLECTOMY N/A 10/03/2017   Procedure: TONSILLECTOMY;  Surgeon: Bud FaceVaught, Creighton, MD;  [facility];  Service: ENT;  Laterality: N/A;  . TONSILLECTOMY      Prior to Admission medications   Medication Sig Start Date End Date Taking? Authorizing Provider  lidocaine (XYLOCAINE) 2 % solution Use as directed 10 mLs in the mouth or throat every 6 (six) hours as needed for mouth pain (Swish and spit). 10/03/17   Vaught, Roney Mansreighton, MD  norethindrone (CAMILA) 0.35 MG tablet Take 1 tablet by mouth daily.    [provider]  ondansetron (ZOFRAN) 4 MG tablet Take  1 tablet (4 mg total) by mouth every 8 (eight) hours as needed for nausea or vomiting. 10/03/17   Vaught, Roney Mansreighton, MD  oxyCODONE (ROXICODONE) 5 MG/5ML solution Take 10 mLs (10 mg total) by mouth every 4 (four) hours as needed for severe pain. 10/03/17   Bud FaceVaught, Creighton, MD  sertraline (ZOLOFT) 25 MG tablet Take 25 mg by mouth daily.    [provider]    No Known Allergies  No family history on file.  Social History Social History   Tobacco Use  . Smoking status: Former Smoker    Years: 0.50    Types: Cigarettes    Last attempt to quit: 12/2016    Years since quitting: 1.9  . Smokeless tobacco: Never Used  Substance Use Topics  . Alcohol use: Yes    Alcohol/week: 1.0 standard drinks    Types: 1 Glasses of wine per week  . Drug use: No    Review of Systems Constitutional: Negative for fever. Cardiovascular: Negative for chest pain. Respiratory: Negative for shortness of breath. Gastrointestinal: Negative for abdominal pain, vomiting and diarrhea. Genitourinary: Negative for urinary compaints Musculoskeletal: Negative for musculoskeletal complaints Skin: Negative for skin complaints  Neurological: Negative for headache All other ROS negative  ____________________________________________   PHYSICAL EXAM:  VITAL SIGNS: ED Triage Vitals  Enc Vitals Group     BP 12/13/18 0606 (!) 151/97     Pulse Rate 12/13/18 0606 Marland Kitchen(!)  111     Resp 12/13/18 0606 18     Temp 12/13/18 0606 98.4 F (36.9 C)     Temp Source 12/13/18 0606 Oral     SpO2 12/13/18 0606 98 %     Weight 12/13/18 0606 200 lb (90.7 kg)     Height 12/13/18 0606 5\' 3"  (1.6 m)     Head Circumference --      Peak Flow --      Pain Score 12/13/18 0607 0     Pain Loc --      Pain Edu? --      Excl. in GC? --    Constitutional: Alert and oriented. Well appearing and in no distress. Eyes: Normal exam ENT   Head: Normocephalic and atraumatic.   Mouth/Throat: Mucous membranes are  moist. Cardiovascular: Normal rate, regular rhythm. No murmur Respiratory: Normal respiratory effort without tachypnea nor retractions. Breath sounds are clear  Gastrointestinal: Soft and nontender. No distention.  Musculoskeletal: Nontender with normal range of motion in all extremities.  Neurologic:  Normal speech and language. No gross focal neurologic deficits Skin:  Skin is warm, dry and intact.  Psychiatric: Mood and affect are normal.     INITIAL IMPRESSION / ASSESSMENT AND PLAN / ED COURSE  Pertinent labs & imaging results that were available during my care of the patient were reviewed by me and considered in my medical decision making (see chart for details).  Patient presents to the emergency department for difficulty sleeping leading to increased anxiety per patient.  My evaluated the patient she was initially sleeping calmly in the room, she did awaken to voice and was able to answer questions appropriately and follows all commands.  Patient has no medical complaints, states she had a bad panic attack this morning which she blames on not sleeping all night.  I offered to have the patient talk to psychiatry, states she would rather try a sleep aid to see if it would help.  She denies SI HI or any other concerning symptoms.  We will discharge with a short course of trazodone and have the patient follow-up with her outpatient physician.  Patient agreeable to plan of care.  ____________________________________________   FINAL CLINICAL IMPRESSION(S) / ED DIAGNOSES  Anxiety Insomnia   Minna AntisPaduchowski, Kycen Spalla, MD 12/13/18 [phone]

## 2018-12-13 NOTE — ED Notes (Signed)
Pt voices good understanding of plan of care; French Anaracy, EDT to triage to change pt into behav scrubs

## 2018-12-13 NOTE — ED Notes (Signed)
Pt dressed out into appropriate behavioral health clothing. Pt belongings consist of a black jacket, black shoes, blue socks, black pants, gray shirt, a set of keys, a blue cell phone and a purple bra. Pt has a nose ring in with a clear stone that is a new piercing. Pt asking if she "can keep my nose ring in because it will close in about 45 min?" This tech notified Lisa,RN about this and she said it was fine but pt may have to take it out once in back. Informed pt what RN said, pt understood and stated "I am not going to hurt myself or anyone, I am just here because I can not sleep."

## 2018-12-13 NOTE — ED Triage Notes (Signed)
Patient ambulatory to triage with steady gait, without difficulty or distress noted; pt st has not slept in several days, feeling "exhausted", hx bipolar and having panic attacks due to recent medication changes; pt denies SI or HI

## 2018-12-13 NOTE — ED Notes (Signed)
Pt. Transferred from Triage to room after dressing out and screening for contraband. Report to include Situation, Background, Assessment and Recommendations from RN. Pt. Oriented to Quad including Q15 minute rounds as well as Psychologist, counsellingover and Officer for their protection. Patient is alert and oriented, warm and dry in no acute distress. Patient denies SI, HI, and AVH. Pt stated she hasn't slept for days and she needs a medication to get her sleep. Pt. Encouraged to let me know if needs arise.

## 2019-01-24 ENCOUNTER — Ambulatory Visit: Payer: Self-pay | Admitting: Family Medicine

## 2019-01-24 DIAGNOSIS — Z0289 Encounter for other administrative examinations: Secondary | ICD-10-CM

## 2019-02-18 ENCOUNTER — Encounter: Payer: Self-pay | Admitting: Family Medicine
